# Patient Record
Sex: Male | Born: 1995 | Race: Black or African American | Hispanic: No | Marital: Single | Smoking: Current every day smoker
Health system: Southern US, Community
[De-identification: ages and names within clinical notes are randomized; demographics above are authoritative.]

---
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
## 2016-07-21 ENCOUNTER — Encounter (HOSPITAL_COMMUNITY): Payer: Self-pay | Admitting: Nurse Practitioner

## 2016-07-21 ENCOUNTER — Emergency Department (HOSPITAL_COMMUNITY)
Admission: EM | Admit: 2016-07-21 | Discharge: 2016-07-21 | Disposition: A | Discharge status: Home / Self Care | Payer: Medicaid Other | Attending: Dermatology | Admitting: Dermatology

## 2016-07-21 DIAGNOSIS — Z5321 Procedure and treatment not carried out due to patient leaving prior to being seen by health care provider: Secondary | ICD-10-CM | POA: Diagnosis not present

## 2016-07-21 DIAGNOSIS — R109 Unspecified abdominal pain: Secondary | ICD-10-CM | POA: Insufficient documentation

## 2016-07-21 DIAGNOSIS — R112 Nausea with vomiting, unspecified: Secondary | ICD-10-CM | POA: Insufficient documentation

## 2016-07-21 DIAGNOSIS — Z792 Long term (current) use of antibiotics: Secondary | ICD-10-CM | POA: Diagnosis not present

## 2016-07-21 DIAGNOSIS — F1290 Cannabis use, unspecified, uncomplicated: Secondary | ICD-10-CM | POA: Diagnosis not present

## 2016-07-21 DIAGNOSIS — F17210 Nicotine dependence, cigarettes, uncomplicated: Secondary | ICD-10-CM | POA: Diagnosis not present

## 2016-07-21 NOTE — ED Notes (Signed)
Patient called x2, no answer

## 2016-07-21 NOTE — ED Notes (Signed)
Attempt to call the pt with no answer. Will try again at a later time.

## 2016-07-21 NOTE — ED Triage Notes (Signed)
Pt states he has been having N/V and abdominal pain for the last 2 days.

## 2016-07-21 NOTE — ED Notes (Signed)
Patient called x1, no answer

## 2016-11-27 ENCOUNTER — Ambulatory Visit (HOSPITAL_COMMUNITY)
Admission: EM | Admit: 2016-11-27 | Discharge: 2016-11-27 | Disposition: A | Discharge status: Home / Self Care | Payer: Medicaid Other | Attending: Family Medicine | Admitting: Family Medicine

## 2016-11-27 ENCOUNTER — Encounter (HOSPITAL_COMMUNITY): Payer: Self-pay | Admitting: Emergency Medicine

## 2016-11-27 DIAGNOSIS — H9202 Otalgia, left ear: Secondary | ICD-10-CM

## 2016-11-27 DIAGNOSIS — R0982 Postnasal drip: Secondary | ICD-10-CM

## 2016-11-27 DIAGNOSIS — H6690 Otitis media, unspecified, unspecified ear: Secondary | ICD-10-CM

## 2016-11-27 DIAGNOSIS — H6983 Other specified disorders of Eustachian tube, bilateral: Secondary | ICD-10-CM

## 2016-11-27 MED ORDER — AMOXICILLIN 500 MG PO CAPS: 1000.0000 mg | ORAL_CAPSULE | Freq: Two times a day (BID) | ORAL | 0 refills | Status: DC

## 2016-11-27 NOTE — ED Provider Notes (Signed)
CSN: [id number]     Arrival date & time 11/27/16  1257 History   First MD Initiated Contact with Patient 11/27/16 1434     Chief Complaint  Patient presents with  . Otalgia   (Consider location/radiation/quality/duration/timing/severity/associated sxs/prior Treatment) 21 year old male with left ear pain for one week. Denies any known fevers. He states he feels a popping or pressure feeling in his ear tickly been blowing his nose. He is recently had URI symptoms which are gradually abating. He also has a cough. Currently he is afebrile. He states that he is using a friends eardrop medicine that starts with a C, possibly Cortisporin.      History reviewed. No pertinent past medical history. History reviewed. No pertinent surgical history. History reviewed. No pertinent family history. Social History  Substance Use Topics  . Smoking status: Current Every Day Smoker    Packs/day: 2.00    Types: Cigarettes  . Smokeless tobacco: Never Used  . Alcohol use No    Review of Systems  Constitutional: Negative.  Negative for diaphoresis, fatigue and fever.  HENT: Positive for ear pain, postnasal drip, rhinorrhea and sore throat. Negative for ear discharge, facial swelling and trouble swallowing.   Eyes: Negative for pain, discharge and redness.  Respiratory: Positive for cough. Negative for chest tightness and shortness of breath.   Cardiovascular: Negative.   Gastrointestinal: Negative.   Musculoskeletal: Negative.  Negative for neck pain and neck stiffness.  Neurological: Negative.   All other systems reviewed and are negative.   Allergies  Patient has no known allergies.  Home Medications   Prior to Admission medications   Medication Sig Start Date End Date Taking? Authorizing Provider  amoxicillin (AMOXIL) 500 MG capsule Take 2 capsules (1,000 mg total) by mouth 2 (two) times daily. 11/27/16   Hayden Rasmussen, NP   Meds Ordered and Administered this Visit  Medications - No data to  display  BP 121/68 (BP Location: Left Arm)   Pulse 79   Temp 98.7 F (37.1 C) (Oral)   Resp 20   SpO2 100%  No data found.   Physical Exam  Constitutional: He is oriented to person, place, and time. He appears well-developed and well-nourished. No distress.  HENT:  Head: Normocephalic and atraumatic.  Right TM is normal. Left TM darkly erythematous, loss of light reflex, minimal bulge at 5:00. No evidence of bleeding or rupture. The EAC is free of moisture, swelling or erythema. Tenderness along the soft tissue just below the ears which track over the eustachian tubes.  Oropharynx with minor erythema much cobblestoning and moderate amount of clear PND.  Neck: Normal range of motion. Neck supple.  Cardiovascular: Normal rate and regular rhythm.   Pulmonary/Chest: Effort normal and breath sounds normal. No respiratory distress. He has no wheezes.  Musculoskeletal: Normal range of motion. He exhibits no edema.  Lymphadenopathy:    He has no cervical adenopathy.  Neurological: He is alert and oriented to person, place, and time.  Skin: Skin is warm and dry. No rash noted.  Psychiatric: He has a normal mood and affect.  Nursing note and vitals reviewed.   Urgent Care Course     Procedures (including critical care time)  Labs Review Labs Reviewed - No data to display  Imaging Review No results found.   Visual Acuity Review  Right Eye Distance:   Left Eye Distance:   Bilateral Distance:    Right Eye Near:   Left Eye Near:    Bilateral Near:  MDM   1. Acute otitis media, unspecified otitis media type   2. PND (post-nasal drip)   3. Otalgia of left ear   4. ETD (Eustachian tube dysfunction), bilateral    In addition to taking the antibiotic recommend taking Sudafed PE 10 mg every 4-6 hours for the next few days for congestion. Also recommend taking either Zyrtec 10 mg or Allegra 60 mg twice a day to help with drainage down the back of the throat which is  contributing to cough, throat irritation and clogging of the ears. The sure to drink plenty fluids and sterile hydrated. This will go away in a few days. Meds ordered this encounter  Medications  . amoxicillin (AMOXIL) 500 MG capsule    Sig: Take 2 capsules (1,000 mg total) by mouth 2 (two) times daily.    Dispense:  40 capsule    Refill:  0    Order Specific Question:   Supervising Provider    Answer:   LAUENSTEIN, KURElvina Sidle [[id number]]        Hayden Rasmussenavid Amjad Fikes, NP 11/27/16 1507

## 2016-11-27 NOTE — ED Triage Notes (Signed)
Here for left ear pain onset 1 week associated w/yellow drainage, prod cough  Denies fevers  significant other and child are being treated for flu sx.   A&O x4... NAD

## 2016-11-27 NOTE — Discharge Instructions (Signed)
In addition to taking the antibiotic recommend taking Sudafed PE 10 mg every 4-6 hours for the next few days for congestion. Also recommend taking either Zyrtec 10 mg or Allegra 60 mg twice a day to help with drainage down the back of the throat which is contributing to cough, throat irritation and clogging of the ears. The sure to drink plenty fluids and sterile hydrated. This will go away in a few days.

## 2019-04-15 ENCOUNTER — Ambulatory Visit (HOSPITAL_COMMUNITY)
Admission: EM | Admit: 2019-04-15 | Discharge: 2019-04-15 | Disposition: A | Discharge status: Home / Self Care | Payer: Self-pay | Attending: Family Medicine | Admitting: Family Medicine

## 2019-04-15 ENCOUNTER — Ambulatory Visit (INDEPENDENT_AMBULATORY_CARE_PROVIDER_SITE_OTHER): Payer: Self-pay

## 2019-04-15 ENCOUNTER — Other Ambulatory Visit: Payer: Self-pay

## 2019-04-15 ENCOUNTER — Encounter (HOSPITAL_COMMUNITY): Payer: Self-pay | Admitting: Emergency Medicine

## 2019-04-15 DIAGNOSIS — R0781 Pleurodynia: Secondary | ICD-10-CM

## 2019-04-15 NOTE — ED Triage Notes (Signed)
Pt here for left sided rib pain worse with movement and palpation; pt sts started after wrestling with friend

## 2019-04-15 NOTE — Discharge Instructions (Signed)
Your x-ray was normal.  This is most likely musculoskeletal pain You can take ibuprofen or Aleve over-the-counter as needed Follow up as needed for continued or worsening symptoms

## 2019-04-15 NOTE — ED Provider Notes (Signed)
[name]    CSN: [id number] Arrival date & time: 04/15/19  1036     History   Chief Complaint Chief Complaint  Patient presents with  . rib pain    HPI Jimmy Henry is a 23 y.o. male.   Patient is a 23 year old male that presents today with left rib pain.  This is been present, waxing waning since few days ago.  Started after wrestling with a friend.  Reporting sharp pain with inspiration and coughing.  Pain with certain movements.  Denies any shortness of breath.  History of smoking.  No cough, chest congestion or fevers.  ROS per HPI      History reviewed. No pertinent past medical history.  There are no active problems to display for this patient.   History reviewed. No pertinent surgical history.     Home Medications    Prior to Admission medications   Not on File    Family History Family History  Problem Relation Age of Onset  . Healthy Mother   . Healthy Father     Social History Social History   Tobacco Use  . Smoking status: Current Every Day Smoker    Packs/day: 2.00    Types: Cigarettes  . Smokeless tobacco: Never Used  Substance Use Topics  . Alcohol use: No  . Drug use: Yes    Types: Marijuana     Allergies   Patient has no known allergies.   Review of Systems Review of Systems   Physical Exam Triage Vital Signs ED Triage Vitals [04/15/19 1059]  Enc Vitals Group     BP 126/85     Pulse Rate 62     Resp 18     Temp 98.3 F (36.8 C)     Temp Source Oral     SpO2 100 %     Weight      Height      Head Circumference      Peak Flow      Pain Score 5     Pain Loc      Pain Edu?      Excl. in [place]?    No data found.  Updated Vital Signs BP 126/85 (BP Location: Right Arm)   Pulse 62   Temp 98.3 F (36.8 C) (Oral)   Resp 18   SpO2 100%   Visual Acuity Right Eye Distance:   Left Eye Distance:   Bilateral Distance:    Right Eye Near:   Left Eye Near:    Bilateral Near:     Physical Exam  Vitals signs and nursing note reviewed.  Constitutional:      General: He is not in acute distress.    Appearance: Normal appearance. He is not ill-appearing, toxic-appearing or diaphoretic.  HENT:     Head: Normocephalic and atraumatic.     Nose: Nose normal.     Mouth/Throat:     Pharynx: Oropharynx is clear.  Eyes:     Conjunctiva/sclera: Conjunctivae normal.  Neck:     Musculoskeletal: Normal range of motion.  Pulmonary:     Effort: Pulmonary effort is normal.  Chest:     Chest wall: Tenderness present. No mass, lacerations, deformity, swelling, crepitus or edema.    Musculoskeletal: Normal range of motion.  Skin:    General: Skin is warm and dry.  Neurological:     Mental Status: He is alert.  Psychiatric:        Mood and Affect: Mood normal.  UC Treatments / Results  Labs (all labs ordered are listed, but only abnormal results are displayed) Labs Reviewed - No data to display  EKG None  Radiology Dg Ribs Unilateral W/chest Left  Result Date: 04/15/2019 CLINICAL DATA:  Left anterior rib pain for 3 days. Pain with deep breath. EXAM: LEFT RIBS AND CHEST - 3+ VIEW COMPARISON:  None. FINDINGS: The heart size is normal. The lungs are clear. Dedicated imaging of the left ribs demonstrates no acute or healing fracture. IMPRESSION: Negative chest x-ray and left rib radiographs. Electronically Signed   By: Marin Robertshristopher  Mattern M.D.   On: 04/15/2019 11:43    Procedures Procedures (including critical care time)  Medications Ordered in UC Medications - No data to display  Initial Impression / Assessment and Plan / UC Course  I have reviewed the triage vital signs and the nursing notes.  Pertinent labs & imaging results that were available during my care of the patient were reviewed by me and considered in my medical decision making (see chart for details).     X-ray negative for any abnormalities.  Most likely musculoskeletal chest wall/rib pain. Recommended  taking ibuprofen or Aleve for pain Follow up as needed for continued or worsening symptoms  Final Clinical Impressions(s) / UC Diagnoses   Final diagnoses:  Rib pain     Discharge Instructions     Your x-ray was normal.  This is most likely musculoskeletal pain You can take ibuprofen or Aleve over-the-counter as needed Follow up as needed for continued or worsening symptoms     ED Prescriptions    None     Controlled Substance Prescriptions [place] Controlled Substance Registry consulted? Not Applicable   Janace ArisBast, Gavinn Collard A, NP 04/15/19 1237

## 2019-06-13 ENCOUNTER — Encounter (HOSPITAL_COMMUNITY): Payer: Self-pay

## 2019-06-13 ENCOUNTER — Ambulatory Visit (HOSPITAL_COMMUNITY)
Admission: EM | Admit: 2019-06-13 | Discharge: 2019-06-13 | Disposition: A | Discharge status: Home / Self Care | Payer: Self-pay | Attending: Family Medicine | Admitting: Family Medicine

## 2019-06-13 ENCOUNTER — Other Ambulatory Visit: Payer: Self-pay

## 2019-06-13 DIAGNOSIS — R300 Dysuria: Secondary | ICD-10-CM | POA: Insufficient documentation

## 2019-06-13 MED ORDER — AZITHROMYCIN 250 MG PO TABS
1000.0000 mg | ORAL_TABLET | Freq: Once | ORAL | Status: AC
  Administered 2019-06-13: 1000 mg via ORAL

## 2019-06-13 MED ORDER — CEFTRIAXONE SODIUM 250 MG IJ SOLR
INTRAMUSCULAR | Status: AC
  Filled 2019-06-13: qty 250

## 2019-06-13 MED ORDER — AZITHROMYCIN 250 MG PO TABS
ORAL_TABLET | ORAL | Status: AC
  Filled 2019-06-13: qty 4

## 2019-06-13 MED ORDER — CEFTRIAXONE SODIUM 250 MG IJ SOLR
250.0000 mg | Freq: Once | INTRAMUSCULAR | Status: AC
  Administered 2019-06-13: 12:00:00 250 mg via INTRAMUSCULAR

## 2019-06-13 NOTE — ED Provider Notes (Signed)
[facility] [place]    CSN: [id number] Arrival date & time: 06/13/19  1129     History   Chief Complaint Chief Complaint  Patient presents with  . Exposure to STD    HPI Jimmy Henry is a 23 y.o. male.   Pt is a 23 year old male that comes in for dysuria and penile discharge. This has been present and worse over the past few days. Recent sexual encounter unprotected. Concerned for STD. No abdominal pain, back pain, fever. No rash, testicular swelling or pain.   ROS per HPI      History reviewed. No pertinent past medical history.  There are no active problems to display for this patient.   History reviewed. No pertinent surgical history.     Home Medications    Prior to Admission medications   Not on File    Family History Family History  Problem Relation Age of Onset  . Healthy Mother   . Healthy Father     Social History Social History   Tobacco Use  . Smoking status: Current Every Day Smoker    Packs/day: 2.00    Types: Cigarettes  . Smokeless tobacco: Never Used  Substance Use Topics  . Alcohol use: No  . Drug use: Yes    Types: Marijuana     Allergies   Patient has no known allergies.   Review of Systems Review of Systems   Physical Exam Triage Vital Signs ED Triage Vitals  Enc Vitals Group     BP 06/13/19 1203 131/74     Pulse Rate 06/13/19 1203 72     Resp 06/13/19 1203 16     Temp 06/13/19 1203 98.2 F (36.8 C)     Temp Source 06/13/19 1203 Temporal     SpO2 06/13/19 1203 100 %     Weight --      Height --      Head Circumference --      Peak Flow --      Pain Score 06/13/19 1145 0     Pain Loc --      Pain Edu? --      Excl. in [place]? --    No data found.  Updated Vital Signs BP 131/74 (BP Location: Left Arm)   Pulse 72   Temp 98.2 F (36.8 C) (Temporal)   Resp 16   SpO2 100%   Visual Acuity Right Eye Distance:   Left Eye Distance:   Bilateral Distance:    Right Eye Near:   Left Eye Near:     Bilateral Near:     Physical Exam Vitals signs and nursing note reviewed.  Constitutional:      Appearance: Normal appearance.  HENT:     Head: Normocephalic and atraumatic.     Nose: Nose normal.  Eyes:     Conjunctiva/sclera: Conjunctivae normal.  Neck:     Musculoskeletal: Normal range of motion.  Pulmonary:     Effort: Pulmonary effort is normal.  Abdominal:     Palpations: Abdomen is soft.     Tenderness: There is no abdominal tenderness.  Musculoskeletal: Normal range of motion.  Skin:    General: Skin is warm and dry.  Neurological:     Mental Status: He is alert.  Psychiatric:        Mood and Affect: Mood normal.      UC Treatments / Results  Labs (all labs ordered are listed, but only abnormal results are displayed) Labs Reviewed  URINE CYTOLOGY ANCILLARY ONLY    EKG   Radiology No results found.  Procedures Procedures (including critical care time)  Medications Ordered in UC Medications  cefTRIAXone (ROCEPHIN) injection 250 mg (250 mg Intramuscular Given 06/13/19 1215)  azithromycin (ZITHROMAX) tablet 1,000 mg (1,000 mg Oral Given 06/13/19 1217)  azithromycin (ZITHROMAX) 250 MG tablet (has no administration in time range)  cefTRIAXone (ROCEPHIN) 250 MG injection (has no administration in time range)    Initial Impression / Assessment and Plan / UC Course  I have reviewed the triage vital signs and the nursing notes.  Pertinent labs & imaging results that were available during my care of the patient were reviewed by me and considered in my medical decision making (see chart for details).     Treating prophylactically for gonorrhea and chlamydia based on symptoms and exposure. Sending urine for cytology with labs pending Recommended safe sex practice Final Clinical Impressions(s) / UC Diagnoses   Final diagnoses:  Dysuria     Discharge Instructions     Sending your urine for testing.  We are treating you for gonorrhea and chlamydia today.  We will call you with any positive results.  You can also check your MyChart for results. Please refrain from sexual activity for at least 7 days until you know the infection is cleared.    ED Prescriptions    None     Controlled Substance Prescriptions [place] Controlled Substance Registry consulted? Not Applicable   Janace ArisBast, Percival Glasheen A, NP 06/13/19 1232

## 2019-06-13 NOTE — Discharge Instructions (Signed)
Sending your urine for testing.  We are treating you for gonorrhea and chlamydia today. We will call you with any positive results.  You can also check your MyChart for results. Please refrain from sexual activity for at least 7 days until you know the infection is cleared.

## 2019-06-13 NOTE — ED Triage Notes (Signed)
Pt here for STD treatment, states he has a burning sensation while he urinates.

## 2019-06-14 LAB — URINE CYTOLOGY ANCILLARY ONLY
Chlamydia: NEGATIVE
Neisseria Gonorrhea: POSITIVE — AB
Trichomonas: NEGATIVE

## 2019-06-15 ENCOUNTER — Telehealth (HOSPITAL_COMMUNITY): Payer: Self-pay | Admitting: Emergency Medicine

## 2019-06-15 NOTE — Telephone Encounter (Signed)
Patient contacted and made aware of   cytology results, all questions answered  

## 2019-06-15 NOTE — Telephone Encounter (Signed)
Test for gonorrhea was positive. This was treated at the urgent care visit with IM rocephin 250mg  and po zithromax 1g. Pt needs education to refrain from sexual intercourse for 7 days after treatment to give the medicine time to work. Sexual partners need to be notified and tested/treated. Condoms may reduce risk of reinfection. Recheck or followup with PCP for further evaluation if symptoms are not improving. [facility] notified.   Attempted to reach patient. Mother answered, stated she would have him call me back.
